# Patient Record
Sex: Male | Born: 1985 | Race: Black or African American | Hispanic: No | Marital: Single | Smoking: Never smoker
Health system: Southern US, Community
[De-identification: ages and names within clinical notes are randomized; demographics above are authoritative.]

---

## 2018-08-27 ENCOUNTER — Other Ambulatory Visit: Payer: Self-pay

## 2018-08-27 ENCOUNTER — Emergency Department (HOSPITAL_COMMUNITY)
Admission: EM | Admit: 2018-08-27 | Discharge: 2018-08-28 | Discharge status: Against Medical Advice | Payer: Self-pay | Attending: Emergency Medicine | Admitting: Emergency Medicine

## 2018-08-27 ENCOUNTER — Emergency Department (HOSPITAL_COMMUNITY): Payer: Self-pay

## 2018-08-27 DIAGNOSIS — Z5321 Procedure and treatment not carried out due to patient leaving prior to being seen by health care provider: Secondary | ICD-10-CM | POA: Insufficient documentation

## 2018-08-27 LAB — I-STAT TROPONIN, ED: Troponin i, poc: 0.01 ng/mL (ref 0.00–0.08)

## 2018-08-27 LAB — BASIC METABOLIC PANEL
Anion gap: 12 (ref 5–15)
BUN: 11 mg/dL (ref 6–20)
CO2: 28 mmol/L (ref 22–32)
Calcium: 9.4 mg/dL (ref 8.9–10.3)
Chloride: 102 mmol/L (ref 98–111)
Creatinine, Ser: 1.24 mg/dL (ref 0.61–1.24)
GFR calc Af Amer: >60 mL/min (ref >60)
GFR calc non Af Amer: >60 mL/min (ref >60)
Glucose, Bld: 90 mg/dL (ref 70–99)
POTASSIUM: 3.4 mmol/L — AB (ref 3.5–5.1)
Sodium: 142 mmol/L (ref 135–145)

## 2018-08-27 LAB — CBC
HCT: 44.5 % (ref 39.0–52.0)
Hemoglobin: 14.8 g/dL (ref 13.0–17.0)
MCH: 28.2 pg (ref 26.0–34.0)
MCHC: 33.3 g/dL (ref 30.0–36.0)
MCV: 84.9 fL (ref 80.0–100.0)
Platelets: 261 10*3/uL (ref 150–400)
RBC: 5.24 MIL/uL (ref 4.22–5.81)
RDW: 12.8 % (ref 11.5–15.5)
WBC: 11.3 10*3/uL — ABNORMAL HIGH (ref 4.0–10.5)
nRBC: 0 % (ref 0.0–0.2)

## 2018-08-27 NOTE — ED Triage Notes (Signed)
Pt complaining of chest pain in left side starting in neck and radiating down to hand that began about a week ago. Sharp stabbing pain began today. No sweating noted. No nausea or vomiting present.

## 2018-08-28 NOTE — ED Notes (Signed)
Pt up to desk stating that he will not be able to wait any longer. Pt advised to follow up with primary care. Armband removed.

## 2019-10-28 IMAGING — DX DG CHEST 2V
2 series · 2 of 2 positions shown · non-contrast
Comparison: None.

CLINICAL DATA: RIGHT chest pain for 3 days. LEFT shoulder pain
today.

EXAM:
CHEST - 2 VIEW

[chest pa]
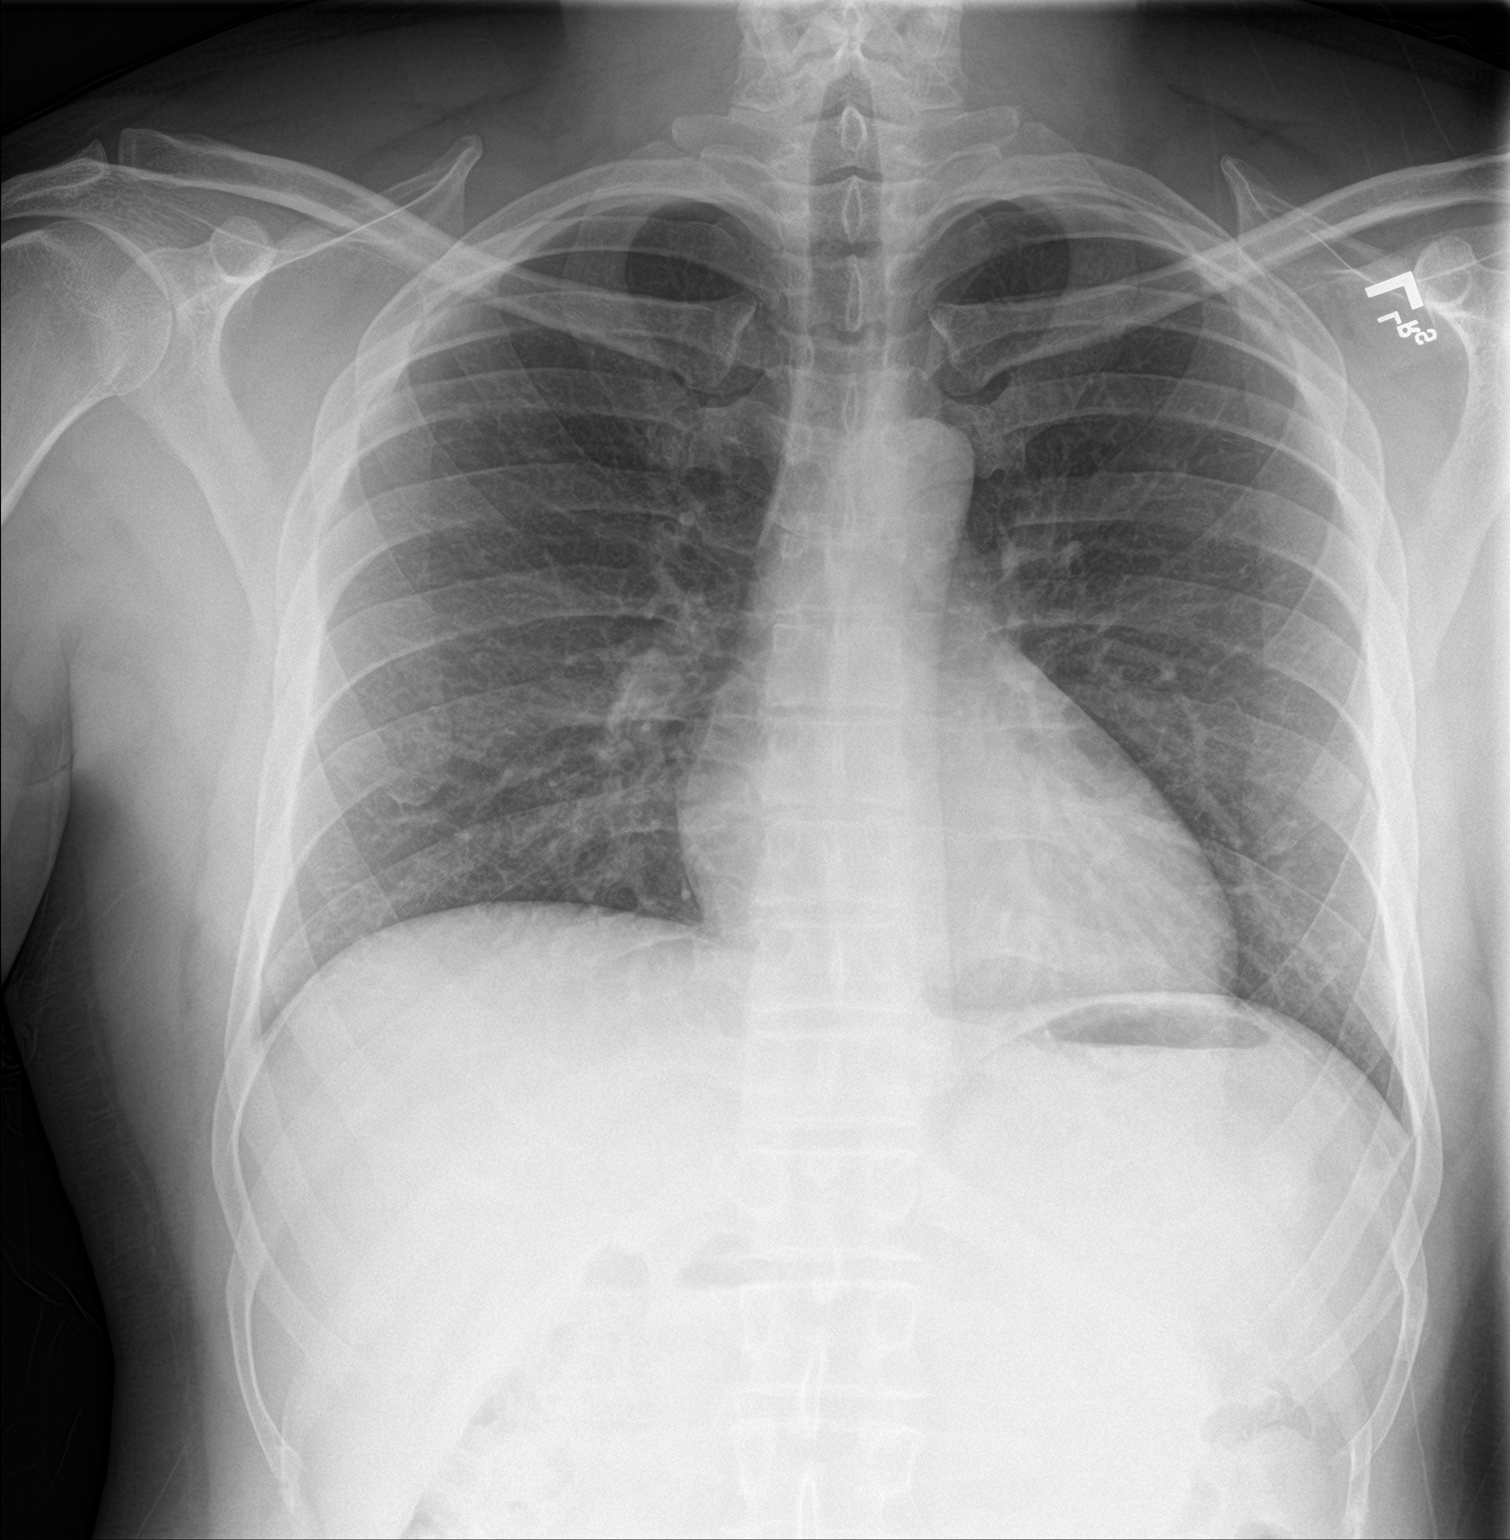

[chest lat]
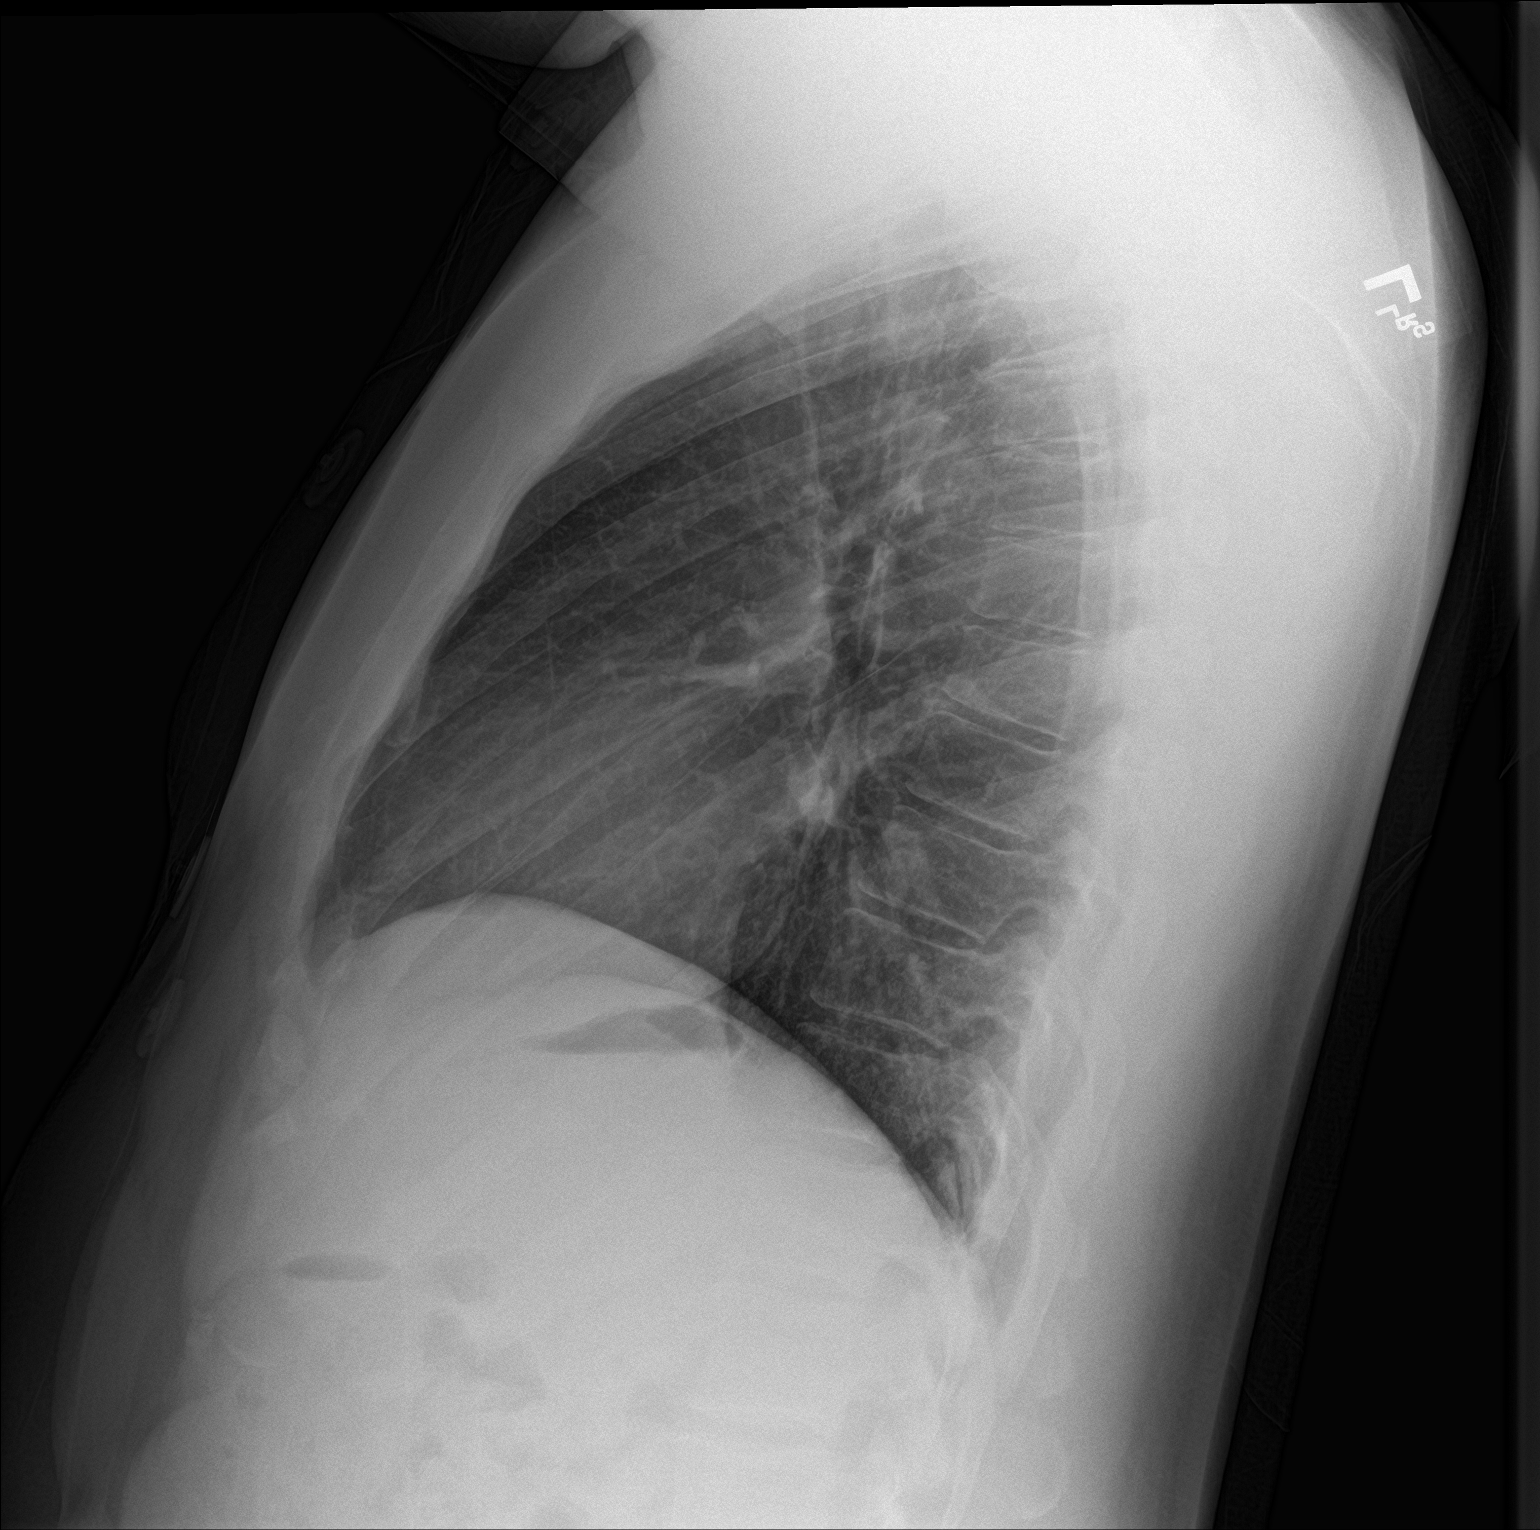

[2 of 2 positions shown; findings below may reference images not displayed]

FINDINGS: Cardiomediastinal silhouette is normal. No pleural effusions or
focal consolidations. Trachea projects midline and there is no
pneumothorax. Soft tissue planes and included osseous structures are
non-suspicious.
IMPRESSION: Normal chest radiograph.

## 2020-01-20 ENCOUNTER — Ambulatory Visit: Payer: Self-pay | Attending: Internal Medicine

## 2020-01-20 DIAGNOSIS — Z23 Encounter for immunization: Secondary | ICD-10-CM

## 2020-01-20 NOTE — Progress Notes (Signed)
   Covid-[facility] Vaccination [facility]  Name:  Alhassan Everingham    MRN: [id number] DOB: 1986-01-28  01/20/2020  Mr. Starace was observed post Covid-19 immunization for 15 minutes without incident. He was provided with Vaccine Information Sheet and instruction to access the V-Safe system.   Mr. Recore was instructed to call 911 with any severe reactions post vaccine: Marland Kitchen Difficulty breathing  . Swelling of face and throat  . A fast heartbeat  . A bad rash all over body  . Dizziness and weakness   Immunizations Administered    Name Date Dose VIS Date Route   Moderna COVID-19 Vaccine 01/20/2020 11:47 AM 0.5 mL 08/2019 Intramuscular   Manufacturer: Moderna   Lot: [id number]   NDC: 60109-323-55

## 2021-05-20 ENCOUNTER — Telehealth (HOSPITAL_COMMUNITY): Payer: Self-pay | Admitting: Emergency Medicine

## 2021-05-20 ENCOUNTER — Encounter (HOSPITAL_COMMUNITY): Payer: Self-pay | Admitting: Emergency Medicine

## 2021-05-20 ENCOUNTER — Other Ambulatory Visit: Payer: Self-pay

## 2021-05-20 ENCOUNTER — Ambulatory Visit (HOSPITAL_COMMUNITY)
Admission: EM | Admit: 2021-05-20 | Discharge: 2021-05-20 | Disposition: A | Discharge status: Home / Self Care | Payer: BLUE CROSS/BLUE SHIELD | Attending: Physician Assistant | Admitting: Physician Assistant

## 2021-05-20 DIAGNOSIS — K0889 Other specified disorders of teeth and supporting structures: Secondary | ICD-10-CM

## 2021-05-20 MED ORDER — KETOROLAC TROMETHAMINE 30 MG/ML IJ SOLN: 30.0000 mg | Freq: Once | INTRAMUSCULAR | Status: DC

## 2021-05-20 MED ORDER — AMOXICILLIN-POT CLAVULANATE 875-125 MG PO TABS: 1.0000 | ORAL_TABLET | Freq: Two times a day (BID) | ORAL | 0 refills | Status: AC

## 2021-05-20 NOTE — Discharge Instructions (Addendum)
Can continue with Tylenol Take antibiotic as prescribed Follow up with dentist
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
## 2021-05-20 NOTE — Telephone Encounter (Signed)
Received message that patient had called and reported pharmacy did not have his medicine. Called pharmacy-[facility]/[address].  Pharmacy staff did put me on hold but eventually came back saying they could get antibiotic ready.  Called patient and informed him that antibiotic was being prepared.   Called patient, verified identity with 2 identifiers.

## 2021-05-20 NOTE — ED Triage Notes (Signed)
Tooth that is reported as broken is on the bottom, left.  Tooth pain is throbbing.  Pain started last night.

## 2024-01-14 ENCOUNTER — Emergency Department (HOSPITAL_COMMUNITY)
Admission: EM | Admit: 2024-01-14 | Discharge: 2024-01-14 | Disposition: A | Discharge status: Home / Self Care | Attending: Emergency Medicine | Admitting: Emergency Medicine

## 2024-01-14 ENCOUNTER — Emergency Department (HOSPITAL_COMMUNITY)

## 2024-01-14 ENCOUNTER — Encounter (HOSPITAL_COMMUNITY): Payer: Self-pay

## 2024-01-14 ENCOUNTER — Other Ambulatory Visit: Payer: Self-pay

## 2024-01-14 DIAGNOSIS — R55 Syncope and collapse: Secondary | ICD-10-CM | POA: Diagnosis present

## 2024-01-14 DIAGNOSIS — Y92410 Unspecified street and highway as the place of occurrence of the external cause: Secondary | ICD-10-CM | POA: Insufficient documentation

## 2024-01-14 DIAGNOSIS — R0789 Other chest pain: Secondary | ICD-10-CM | POA: Diagnosis not present

## 2024-01-14 DIAGNOSIS — R410 Disorientation, unspecified: Secondary | ICD-10-CM | POA: Diagnosis not present

## 2024-01-14 LAB — CBC WITH DIFFERENTIAL/PLATELET
Abs Immature Granulocytes: 0.02 10*3/uL (ref 0.00–0.07)
Basophils Absolute: 0.1 10*3/uL (ref 0.0–0.1)
Basophils Relative: 1 %
Eosinophils Absolute: 0.4 10*3/uL (ref 0.0–0.5)
Eosinophils Relative: 5 %
HCT: 41.9 % (ref 39.0–52.0)
Hemoglobin: 14.3 g/dL (ref 13.0–17.0)
Immature Granulocytes: 0 %
Lymphocytes Relative: 23 %
Lymphs Abs: 1.8 10*3/uL (ref 0.7–4.0)
MCH: 29.1 pg (ref 26.0–34.0)
MCHC: 34.1 g/dL (ref 30.0–36.0)
MCV: 85.2 fL (ref 80.0–100.0)
Monocytes Absolute: 0.8 10*3/uL (ref 0.1–1.0)
Monocytes Relative: 10 %
Neutro Abs: 4.8 10*3/uL (ref 1.7–7.7)
Neutrophils Relative %: 61 %
Platelets: 208 10*3/uL (ref 150–400)
RBC: 4.92 MIL/uL (ref 4.22–5.81)
RDW: 12.6 % (ref 11.5–15.5)
WBC: 7.8 10*3/uL (ref 4.0–10.5)
nRBC: 0 % (ref 0.0–0.2)

## 2024-01-14 LAB — COMPREHENSIVE METABOLIC PANEL WITH GFR
ALT: 39 U/L (ref 0–44)
AST: 29 U/L (ref 15–41)
Albumin: 3.7 g/dL (ref 3.5–5.0)
Alkaline Phosphatase: 57 U/L (ref 38–126)
Anion gap: 10 (ref 5–15)
BUN: 11 mg/dL (ref 6–20)
CO2: 23 mmol/L (ref 22–32)
Calcium: 8.9 mg/dL (ref 8.9–10.3)
Chloride: 106 mmol/L (ref 98–111)
Creatinine, Ser: 1.29 mg/dL — ABNORMAL HIGH (ref 0.61–1.24)
GFR, Estimated: >60 mL/min (ref >60)
Glucose, Bld: 95 mg/dL (ref 70–99)
Potassium: 3.3 mmol/L — ABNORMAL LOW (ref 3.5–5.1)
Sodium: 139 mmol/L (ref 135–145)
Total Bilirubin: 0.7 mg/dL (ref 0.0–1.2)
Total Protein: 6.9 g/dL (ref 6.5–8.1)

## 2024-01-14 LAB — URINALYSIS, ROUTINE W REFLEX MICROSCOPIC
Bilirubin Urine: NEGATIVE
Glucose, UA: NEGATIVE mg/dL
Hgb urine dipstick: NEGATIVE
Ketones, ur: NEGATIVE mg/dL
Leukocytes,Ua: NEGATIVE
Nitrite: NEGATIVE
Protein, ur: NEGATIVE mg/dL
Specific Gravity, Urine: 1.012 (ref 1.005–1.030)
pH: 7 (ref 5.0–8.0)

## 2024-01-14 LAB — TROPONIN I (HIGH SENSITIVITY): Troponin I (High Sensitivity): 4 ng/L (ref <18)

## 2024-01-14 MED ORDER — SODIUM CHLORIDE 0.9 % IV BOLUS
1000.0000 mL | Freq: Once | INTRAVENOUS | Status: AC
  Administered 2024-01-14: 1000 mL via INTRAVENOUS

## 2024-01-14 MED ORDER — IOHEXOL 350 MG/ML SOLN
75.0000 mL | Freq: Once | INTRAVENOUS | Status: AC | PRN
  Administered 2024-01-14: 75 mL via INTRAVENOUS
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
## 2024-01-14 NOTE — ED Notes (Signed)
 Called and placed PT on monitor with CCMD.

## 2024-01-14 NOTE — ED Notes (Signed)
 Patient transported to CT
